# Patient Record
Sex: Male | Born: 1995 | Race: White | Hispanic: No | Marital: Single | Smoking: Never smoker
Health system: Southern US, Community
[De-identification: ages and names within clinical notes are randomized; demographics above are authoritative.]

---
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
## 2018-01-09 ENCOUNTER — Other Ambulatory Visit: Payer: Self-pay

## 2018-01-09 ENCOUNTER — Encounter: Payer: Self-pay | Admitting: Emergency Medicine

## 2018-01-09 ENCOUNTER — Emergency Department
Admission: EM | Admit: 2018-01-09 | Discharge: 2018-01-09 | Disposition: A | Discharge status: Home / Self Care | Payer: BLUE CROSS/BLUE SHIELD | Attending: Emergency Medicine | Admitting: Emergency Medicine

## 2018-01-09 DIAGNOSIS — J101 Influenza due to other identified influenza virus with other respiratory manifestations: Secondary | ICD-10-CM | POA: Diagnosis not present

## 2018-01-09 DIAGNOSIS — R05 Cough: Secondary | ICD-10-CM | POA: Diagnosis present

## 2018-01-09 LAB — INFLUENZA PANEL BY PCR (TYPE A & B)
Influenza A By PCR: NEGATIVE
Influenza B By PCR: POSITIVE — AB

## 2018-01-09 LAB — GROUP A STREP BY PCR: GROUP A STREP BY PCR: NOT DETECTED

## 2018-01-09 MED ORDER — OSELTAMIVIR PHOSPHATE 75 MG PO CAPS: 75.0000 mg | ORAL_CAPSULE | Freq: Two times a day (BID) | ORAL | 0 refills | Status: AC

## 2018-01-09 MED ORDER — KETOROLAC TROMETHAMINE 60 MG/2ML IM SOLN
60.0000 mg | Freq: Once | INTRAMUSCULAR | Status: AC
  Administered 2018-01-09: 60 mg via INTRAMUSCULAR
  Filled 2018-01-09: qty 2

## 2018-01-09 MED ORDER — PSEUDOEPH-BROMPHEN-DM 30-2-10 MG/5ML PO SYRP: 5.0000 mL | ORAL_SOLUTION | Freq: Four times a day (QID) | ORAL | 0 refills | Status: AC | PRN

## 2018-01-09 MED ORDER — IBUPROFEN 600 MG PO TABS: 600.0000 mg | ORAL_TABLET | Freq: Three times a day (TID) | ORAL | 0 refills | Status: AC | PRN

## 2018-01-09 NOTE — ED Triage Notes (Signed)
Pt to ed with c/o cough, fever, congestion that started last night.  Pt alert at triage, skin warm to touch.  Pt states he has body aches at this time.

## 2018-01-09 NOTE — ED Provider Notes (Signed)
[facility] Emergency Department Provider Note   ____________________________________________   First MD Initiated Contact with Patient 01/09/18 1124     (approximate)  I have reviewed the triage vital signs and the nursing notes.   HISTORY  Chief Complaint Fever and Cough    HPI Victor Kent is a 22 y.o. male patient complain acute onset of cough, fever, sore throat, and congestion that started last night.  Patient denies nausea, vomiting, diarrhea.  Patient has not taken a flu shot this season.  Patient described his pain is "achy".  No palliative measures for complaint.   History reviewed. No pertinent past medical history.  There are no active problems to display for this patient.   History reviewed. No pertinent surgical history.  Prior to Admission medications   Medication Sig Start Date End Date Taking? Authorizing Provider  brompheniramine-pseudoephedrine-DM 30-2-10 MG/5ML syrup Take 5 mLs by mouth 4 (four) times daily as needed. 01/09/18   Joni Reining, PA-C  ibuprofen (ADVIL,MOTRIN) 600 MG tablet Take 1 tablet (600 mg total) by mouth every 8 (eight) hours as needed. 01/09/18   Joni Reining, PA-C  oseltamivir (TAMIFLU) 75 MG capsule Take 1 capsule (75 mg total) by mouth 2 (two) times daily for 5 days. 01/09/18 01/14/18  Joni Reining, PA-C    Allergies Patient has no known allergies.  No family history on file.  Social History Social History   Tobacco Use  . Smoking status: Never Smoker  . Smokeless tobacco: Never Used  Substance Use Topics  . Alcohol use: Not Currently    Frequency: Never  . Drug use: Never    Review of Systems Constitutional: Fever and body aches.  Eyes: No visual changes. ENT: Sore throat and nasal congestion.  Cardiovascular: Denies chest pain. Respiratory: Denies shortness of breath.  Nonproductive cough. Gastrointestinal: No abdominal pain.  No nausea, no vomiting.  No diarrhea.  No  constipation. Genitourinary: Negative for dysuria. Musculoskeletal: Negative for back pain. Skin: Negative for rash. Neurological: Negative for headaches, focal weakness or numbness.   ____________________________________________   PHYSICAL EXAM:  VITAL SIGNS: ED Triage Vitals [01/09/18 1049]  Enc Vitals Group     BP (!) 114/57     Pulse Rate (!) 113     Resp 18     Temp (!) 101.5 F (38.6 C)     Temp Source Oral     SpO2 95 %     Weight 200 lb (90.7 kg)     Height      Head Circumference      Peak Flow      Pain Score 10     Pain Loc      Pain Edu?      Excl. in GC?    Constitutional: Alert and oriented. Well appearing and in no acute distress. Nose: Clear rhinorrhea with edematous nasal turbinates. Mouth/Throat: Mucous membranes are moist.  Oropharynx erythematous.  Postnasal drainage tracts. Neck: No stridor. Hematological/Lymphatic/Immunilogical: No cervical lymphadenopathy. Cardiovascular: Tachycardic, regular rhythm. Grossly normal heart sounds.  Good peripheral circulation. Respiratory: Normal respiratory effort.  No retractions. Lungs CTAB. Musculoskeletal: No lower extremity tenderness nor edema.  No joint effusions. Neurologic:  Normal speech and language. No gross focal neurologic deficits are appreciated. No gait instability. Skin:  Skin is warm, dry and intact. No rash noted. Psychiatric: Mood and affect are normal. Speech and behavior are normal.  ____________________________________________   LABS (all labs ordered are listed, but only abnormal results are  displayed)  Labs Reviewed  INFLUENZA PANEL BY PCR (TYPE A & B) - Abnormal; Notable for the following components:      Result Value   Influenza B By PCR POSITIVE (*)    All other components within normal limits  GROUP A STREP BY PCR   ____________________________________________  EKG   ____________________________________________  RADIOLOGY  ED MD interpretation:   Official radiology  report(s): No results found.  ____________________________________________   PROCEDURES  Procedure(s) performed: None  Procedures  Critical Care performed: No  ____________________________________________   INITIAL IMPRESSION / ASSESSMENT AND PLAN / ED COURSE  As part of my medical decision making, I reviewed the following data within the electronic MEDICAL RECORD NUMBER    Patient presented with acute onset of cough fever nasal and chest congestion began last night.  Patient  flu test was positive.  Patient given discharge care instruction advised take medication as directed.  Patient given a work note and advised to follow-up PCP if no improvement in 3-5 days.      ____________________________________________   FINAL CLINICAL IMPRESSION(S) / ED DIAGNOSES  Final diagnoses:  Influenza A     ED Discharge Orders        Ordered    oseltamivir (TAMIFLU) 75 MG capsule  2 times daily     01/09/18 1245    brompheniramine-pseudoephedrine-DM 30-2-10 MG/5ML syrup  4 times daily PRN     01/09/18 1245    ibuprofen (ADVIL,MOTRIN) 600 MG tablet  Every 8 hours PRN     01/09/18 1245       Note:  This document was prepared using Dragon voice recognition software and may include unintentional dictation errors.    Joni ReiningSmith, Lacrisha Bielicki K, PA-C 01/09/18 1249    Emily FilbertWilliams, Jonathan E, MD 01/09/18 [phone]
